# Patient Record
Sex: Male | Born: 2000 | Race: White | Hispanic: No | Marital: Single
Health system: Southern US, Academic
[De-identification: ages and names within clinical notes are randomized; demographics above are authoritative.]

## PROBLEM LIST (undated history)

## (undated) ENCOUNTER — Emergency Department: Payer: Self-pay | Source: Home / Self Care

## (undated) HISTORY — PX: APPENDECTOMY: SHX54

---
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
## 2021-07-13 ENCOUNTER — Ambulatory Visit (INDEPENDENT_AMBULATORY_CARE_PROVIDER_SITE_OTHER): Payer: Self-pay | Admitting: Neurology

## 2022-10-18 ENCOUNTER — Ambulatory Visit (INDEPENDENT_AMBULATORY_CARE_PROVIDER_SITE_OTHER): Payer: Self-pay | Admitting: Neurology

## 2022-10-25 ENCOUNTER — Ambulatory Visit (INDEPENDENT_AMBULATORY_CARE_PROVIDER_SITE_OTHER): Payer: Self-pay | Admitting: Neurology
